# Patient Record
Sex: Male | Born: 1980 | Race: White | Hispanic: No | Marital: Married | Smoking: Current every day smoker
Health system: Southern US, Community
[De-identification: ages and names within clinical notes are randomized; demographics above are authoritative.]

---

## 2001-03-07 ENCOUNTER — Emergency Department (HOSPITAL_COMMUNITY): Admission: EM | Admit: 2001-03-07 | Discharge: 2001-03-07 | Payer: Self-pay | Admitting: Emergency Medicine

## 2001-03-07 ENCOUNTER — Encounter: Payer: Self-pay | Admitting: Emergency Medicine

## 2002-06-12 ENCOUNTER — Ambulatory Visit (HOSPITAL_BASED_OUTPATIENT_CLINIC_OR_DEPARTMENT_OTHER): Admission: RE | Admit: 2002-06-12 | Discharge: 2002-06-12 | Payer: Self-pay | Admitting: General Surgery

## 2005-03-13 ENCOUNTER — Emergency Department (HOSPITAL_COMMUNITY): Admission: EM | Admit: 2005-03-13 | Discharge: 2005-03-13 | Payer: Self-pay | Admitting: Emergency Medicine

## 2008-07-07 ENCOUNTER — Inpatient Hospital Stay (HOSPITAL_COMMUNITY): Admission: EM | Admit: 2008-07-07 | Discharge: 2008-07-11 | Payer: Self-pay | Admitting: Emergency Medicine

## 2009-07-14 IMAGING — CR DG KNEE 1-2V*L*
2 series · 2 of 2 positions shown · non-contrast
Comparison: None.

CLINICAL DATA: Left knee joint infection.  Post aspiration images.

LEFT KNEE - 1-2 VIEW 07/07/2008:

[view not recorded (1 of 2)]
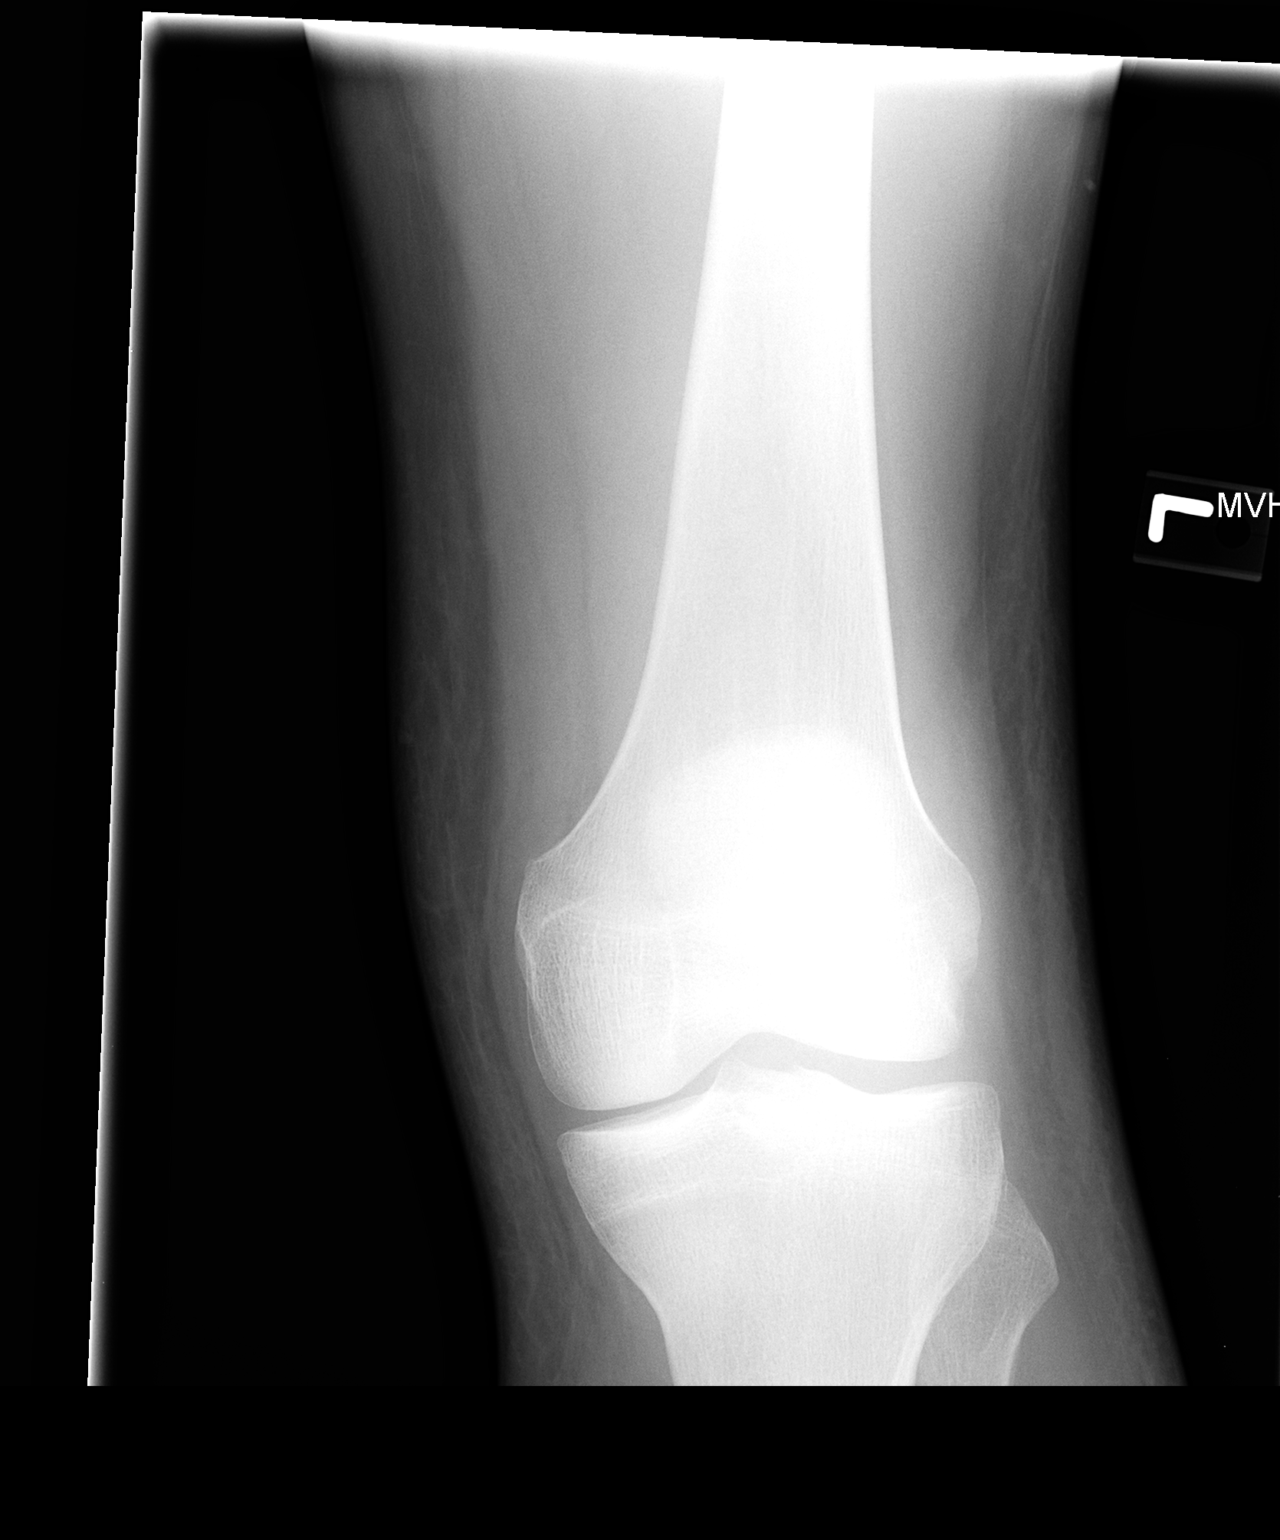

[view not recorded (2 of 2)]
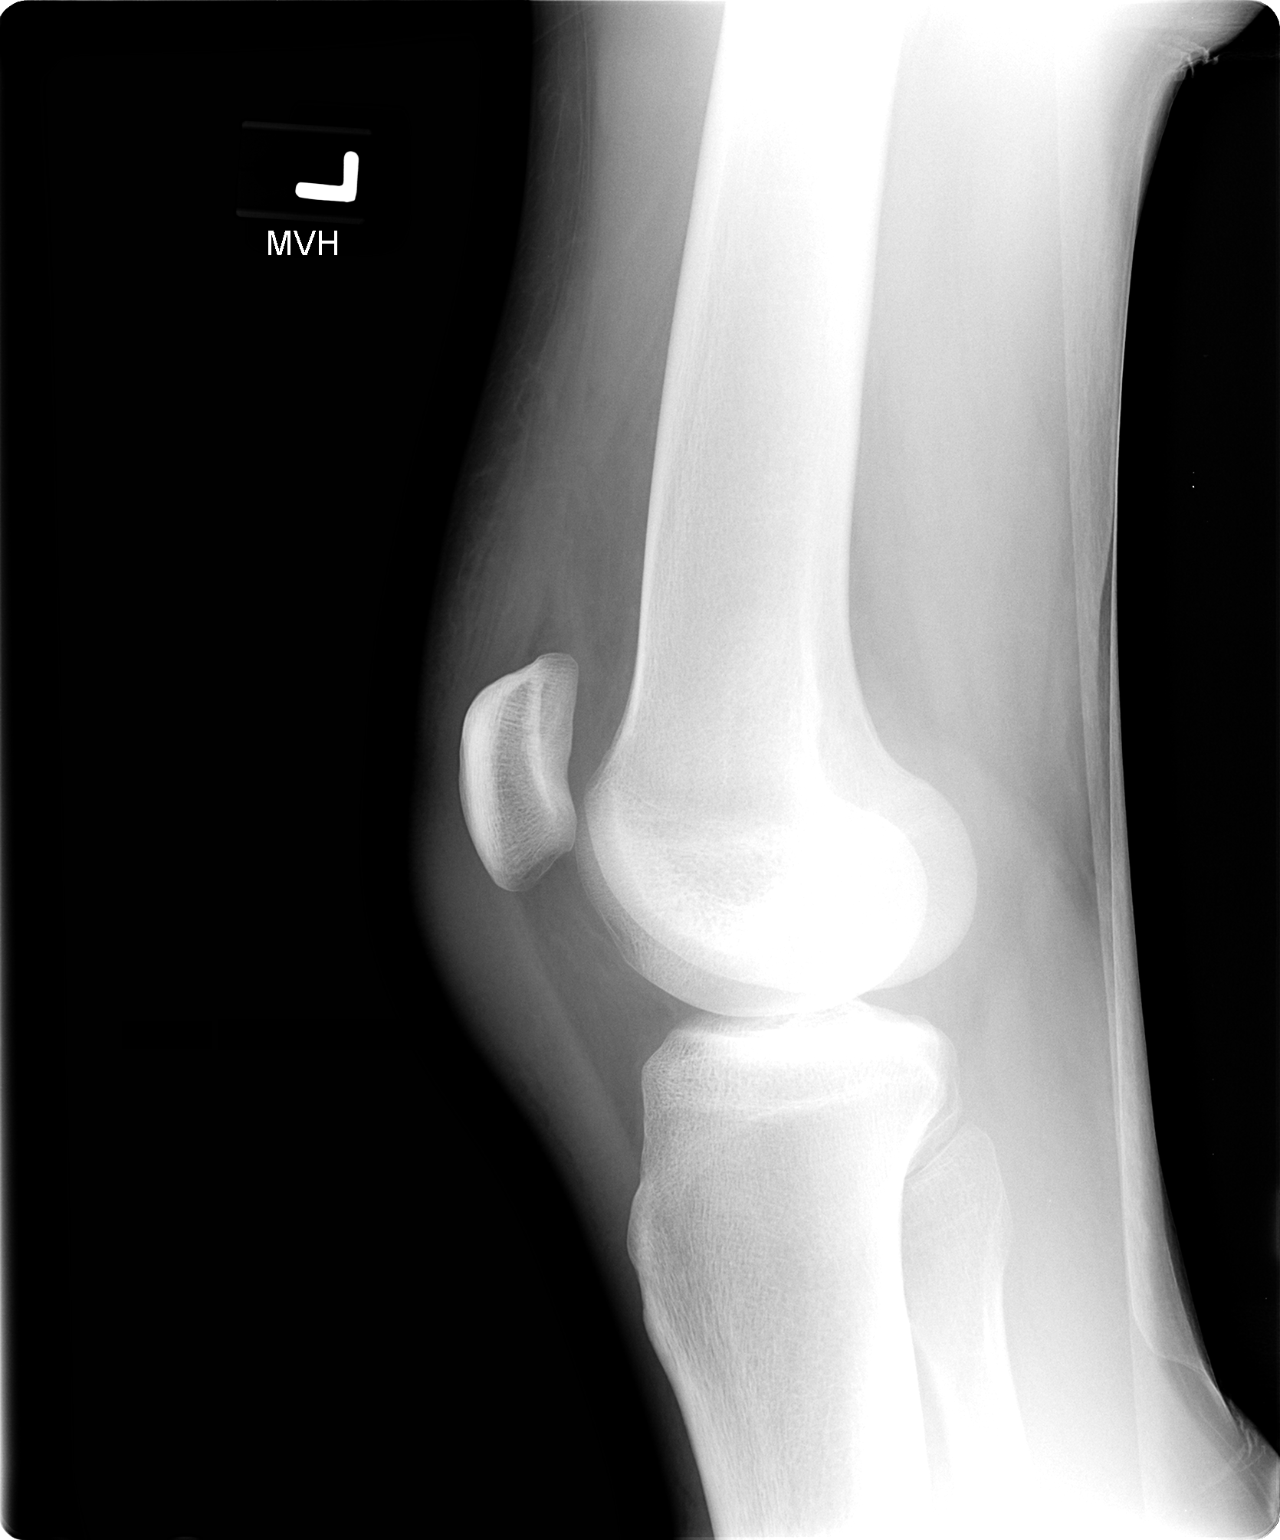

[2 of 2 positions shown; findings below may reference images not displayed]

FINDINGS: No significant residual joint effusion after joint
aspiration.  No underlying osseous abnormalities.  Well-preserved
joint spaces.  Marked soft tissue swelling overlying the patella.
IMPRESSION: No significant residual joint effusion after joint aspiration.  No
skeletal abnormality.

## 2010-10-19 LAB — CBC
HCT: 34.9 % — ABNORMAL LOW (ref 39.0–52.0)
HCT: 36.5 % — ABNORMAL LOW (ref 39.0–52.0)
HCT: 41.1 % (ref 39.0–52.0)
Hemoglobin: 13.8 g/dL (ref 13.0–17.0)
MCHC: 32.9 g/dL (ref 30.0–36.0)
MCHC: 33.5 g/dL (ref 30.0–36.0)
MCHC: 33.6 g/dL (ref 30.0–36.0)
MCHC: 34.2 g/dL (ref 30.0–36.0)
MCV: 92.4 fL (ref 78.0–100.0)
MCV: 93.3 fL (ref 78.0–100.0)
MCV: 94 fL (ref 78.0–100.0)
Platelets: 186 10*3/uL (ref 150–400)
Platelets: 208 10*3/uL (ref 150–400)
Platelets: 220 10*3/uL (ref 150–400)
Platelets: 244 10*3/uL (ref 150–400)
RBC: 3.67 MIL/uL — ABNORMAL LOW (ref 4.22–5.81)
RBC: 4.4 MIL/uL (ref 4.22–5.81)
RDW: 12.2 % (ref 11.5–15.5)
RDW: 12.3 % (ref 11.5–15.5)
WBC: 14.3 10*3/uL — ABNORMAL HIGH (ref 4.0–10.5)
WBC: 5.6 10*3/uL (ref 4.0–10.5)

## 2010-10-19 LAB — DIFFERENTIAL
Basophils Absolute: 0 10*3/uL (ref 0.0–0.1)
Basophils Relative: 0 % (ref 0–1)
Eosinophils Absolute: 0 10*3/uL (ref 0.0–0.7)
Eosinophils Relative: 0 % (ref 0–5)
Lymphocytes Relative: 16 % (ref 12–46)
Lymphs Abs: 2.2 10*3/uL (ref 0.7–4.0)
Monocytes Absolute: 1 10*3/uL (ref 0.1–1.0)
Monocytes Relative: 7 % (ref 3–12)
Neutro Abs: 11 10*3/uL — ABNORMAL HIGH (ref 1.7–7.7)
Neutrophils Relative %: 77 % (ref 43–77)

## 2010-10-19 LAB — BODY FLUID CULTURE: Culture: NO GROWTH

## 2010-10-19 LAB — BASIC METABOLIC PANEL
BUN: 10 mg/dL (ref 6–23)
BUN: 13 mg/dL (ref 6–23)
BUN: 9 mg/dL (ref 6–23)
CO2: 26 mEq/L (ref 19–32)
CO2: 27 mEq/L (ref 19–32)
CO2: 30 mEq/L (ref 19–32)
CO2: 30 mEq/L (ref 19–32)
Calcium: 8.3 mg/dL — ABNORMAL LOW (ref 8.4–10.5)
Calcium: 9.7 mg/dL (ref 8.4–10.5)
Chloride: 100 mEq/L (ref 96–112)
Chloride: 103 mEq/L (ref 96–112)
Chloride: 99 mEq/L (ref 96–112)
Creatinine, Ser: 0.92 mg/dL (ref 0.4–1.5)
Creatinine, Ser: 0.95 mg/dL (ref 0.4–1.5)
Creatinine, Ser: 0.96 mg/dL (ref 0.4–1.5)
Creatinine, Ser: 0.99 mg/dL (ref 0.4–1.5)
GFR calc Af Amer: >60 mL/min (ref >60)
GFR calc Af Amer: >60 mL/min (ref >60)
GFR calc non Af Amer: >60 mL/min (ref >60)
GFR calc non Af Amer: >60 mL/min (ref >60)
Glucose, Bld: 125 mg/dL — ABNORMAL HIGH (ref 70–99)
Glucose, Bld: 99 mg/dL (ref 70–99)
Potassium: 3.2 mEq/L — ABNORMAL LOW (ref 3.5–5.1)
Potassium: 3.4 mEq/L — ABNORMAL LOW (ref 3.5–5.1)
Sodium: 136 mEq/L (ref 135–145)

## 2010-10-19 LAB — ANAEROBIC CULTURE

## 2010-10-19 LAB — RETICULOCYTES
RBC.: 3.98 MIL/uL — ABNORMAL LOW (ref 4.22–5.81)
Retic Count, Absolute: 23.9 10*3/uL (ref 19.0–186.0)
Retic Ct Pct: 0.6 % (ref 0.4–3.1)

## 2010-10-19 LAB — HEMOGLOBINOPATHY EVALUATION
Hgb A2 Quant: 3 % (ref 2.2–3.2)
Hgb F Quant: 0 % (ref 0.0–2.0)

## 2010-10-19 LAB — C-REACTIVE PROTEIN: CRP: 4.6 mg/dL — ABNORMAL HIGH (ref <0.6)

## 2010-10-19 LAB — IRON AND TIBC: Saturation Ratios: 4 % — ABNORMAL LOW (ref 20–55)

## 2010-10-19 LAB — MAGNESIUM: Magnesium: 2.1 mg/dL (ref 1.5–2.5)

## 2010-10-19 LAB — FOLATE: Folate: 7 ng/mL

## 2010-11-17 NOTE — H&P (Signed)
NAME:  Ian Oconnor NO.:  [id number]   MEDICAL RECORD NO.:  [id number]          PATIENT TYPE:  EMS   LOCATION:  ED                           FACILITY:  [facility]   PHYSICIAN:  Lonia Blood, M.D.      DATE OF BIRTH:  August 10, 1980   DATE OF ADMISSION:  07/07/2008  DATE OF DISCHARGE:                              HISTORY & PHYSICAL   PRIMARY CARE PHYSICIAN:  The patient is unassigned to [place].   PRESENTING COMPLAINT:  Left knee pain.   HISTORY OF PRESENT ILLNESS:  The patient is a 30 year old young man with  no significant past medical history who has apparently been doing all  right until yesterday.  The patient started feeling itchy upon his knee.  He subsequently looked  and apparently has like a piece of hair growing  inwards  inside his knee.  He tried to remove it which he thought he  successfully did.  Since then, however, his knee has started pain,  swelling, and redness.  It got unbearable that he could not bear any  weight.  He came to the emergency room where he was seen by orthopedic  surgeon Dr. Shon Baton, and arthrocentesis was performed.  The patient is  now being admitted for further treatment.   PAST MEDICAL HISTORY:  Significant for only head injury.  Otherwise, no  significant past medical history.   MEDICATIONS:  None.   ALLERGIES:  He has no known drug allergies.   SOCIAL HISTORY:  The patient is single, lives with his girlfriend.  Denied any tobacco, alcohol or IV drug use.   FAMILY HISTORY:  Denied any significant family history for diabetes,  hypertension, etc.   REVIEW OF SYSTEMS:  Ten-point review of system is negative except per  HPI.   PHYSICAL EXAMINATION:  Temperature is 98.2, blood pressure 96/65, pulse  67, respiratory rate 18, sats 100% on room air.  GENERAL:  The patient is a pleasant young man in no acute distress.  HEENT:  PERRL.  EOMI.  NECK:  Supple.  No JVD, no lymphadenopathy.  RESPIRATORY:  He has good air entry bilaterally.   No wheezes, no rales.  CARDIOVASCULAR:  S1, S2, no murmur.  ABDOMEN:  Soft, nontender with positive bowel sounds.  EXTREMITIES:  Showed left knee is swollen, red, and tender to touch.  No  obvious derangement.  No shortening of the left lower extremity compared  to the right lower extremity.  No other significant findings.   LABORATORIES:  Showed a white count of 14.3, hemoglobin 13.8, platelet  count 220 with a left shift ANC of 11.  Sodium 136, potassium 3.2,  chloride 100, CO2 27, glucose 99, BUN 13, creatinine 0.92.  Calcium 9.7.  The patient also has arthrocenteses, and results are currently pending.   ASSESSMENT:  Therefore this is a 30 year old young man presented with  what appeared to be septic arthritis from probably penetrating hair into  his joint space.   PLAN:  1. Septic arthritis.  Will admit the patient and start intravenous      Ancef  1 g q.6.  Will await results of Gram stain cytology and      crystals from his arthrocentesis.  Will also get blood cultures and      await the results.  Based on how patient responds, will gradually      transition him to oral antibiotics.  2. Hypokalemia.  I will replete his potassium now.  3. Transient hypertension.  This may be related to pain medicine.  I      will give him intravenous fluids, and watch closely for any signs      of sepsis.      Lonia Blood, M.D.  Electronically Signed     LG/MEDQ  D:  07/07/2008  T:  07/08/2008  Job:  [id number]

## 2010-11-17 NOTE — Discharge Summary (Signed)
NAME:  Ian Oconnor, Ian Oconnor NO.:  [id number]   MEDICAL RECORD NO.:  [id number]          PATIENT TYPE:  INP   LOCATION:  1514                         FACILITY:  [facility]   PHYSICIAN:  Peggye Pitt, M.D. DATE OF BIRTH:  03-28-1981   DATE OF ADMISSION:  07/07/2008  DATE OF DISCHARGE:                               DISCHARGE SUMMARY   DISCHARGE DIAGNOSES:  Left knee cellulitis.   DISCHARGE MEDICATIONS:  1. Doxycycline 100 mg p.o. twice daily for a total of 10 days.  2. Vicodin 5/500 mg to take 1 tablet every 6 hours as needed for pain.      I have dispensed #30 tablets.   DISPOSITION AND FOLLOWUP:  The patient is discharged home in stable  condition.  He has met all of his physical therapy goals.  I have  advised him to call Dr. Jeannetta Nap' office for an appointment within the  next couple weeks to follow up on the resolution of his cellulitis.   CONSULTATIONS THIS HOSPITALIZATION:  Dr. Shon Baton with orthopedics.   IMAGES AND PROCEDURES PERFORMED DURING THIS HOSPITALIZATION:  Include an  x-ray of his left knee on July 07, 2008 that showed no significant  residual joint effusion after joint aspiration with no skeletal  abnormality.   HISTORY AND PHYSICAL EXAM:  For full details please refer to history and  physical dictated on July 07, 2008 by Dr. Mikeal Hawthorne, but in brief, Mr.  Oconnor is a 30 year old Caucasian man with no significant past medical  history who, a couple days of prior to admission, noticed an ingrown  hair over the inferior aspect of his patella on his left knee.  Subsequently, his knee grew to be red and swollen with unbearable pain.  For that reason, he decided to come into the emergency department for  further evaluation.  For that reason, we were called and admitted for  further evaluation and management.   HOSPITAL COURSE BY ACTIVE PROBLEM:  Left knee cellulitis.  Initially Dr.  Shon Baton, orthopedic surgeon, was consulted in the emergency department,  who  performed an arthrocentesis given that the initial concern was for  septic arthritis.  However, cultures from that joint aspiration have not  grown any organisms.  He has received 4 days of Ancef while in the  hospital.  He will subsequently be discharged on doxycycline to take for  10 more days.  I have advised him to follow up with his primary care  physician, Dr. Jeannetta Nap, in a couple weeks to ensure resolution of his  cellulitis.   VITAL SIGNS ON DAY OF DISCHARGE:  Blood pressure 92/65, heart rate 71,  respirations 18, O2 sats 97% on room air with a temperature of 97.8.   LABS ON DAY OF DISCHARGE:  Sodium 136, potassium 4.2, chloride 99,  bicarb 30, BUN 10, creatinine 0.95 and a glucose of 97.  WBCs 5.8,  hemoglobin 13.3, and platelets of 244,000.      Peggye Pitt, M.D.  Electronically Signed     EH/MEDQ  D:  07/11/2008  T:  07/11/2008  Job:  [id number]  cc:   Windle Guard, M.D.  Fax: [phone]   Dr. Shon Baton  Orthopedics

## 2010-11-17 NOTE — Consult Note (Signed)
NAME:  Ian Oconnor, Ian Oconnor NO.:  [id number]   MEDICAL RECORD NO.:  [id number]          PATIENT TYPE:  INP   LOCATION:  1514                         FACILITY:  [facility]   PHYSICIAN:  Alvy Beal, MD    DATE OF BIRTH:  [birth year]   DATE OF CONSULTATION:  DATE OF DISCHARGE:                                 CONSULTATION   Consultation request by the ER staff for evaluation of left knee  infection, question cellulitis versus septic joint.   HISTORY:  Sun is a very pleasant, otherwise healthy 30 year old  gentleman who was in his usual state of good stated health until Friday  when he noticed an ingrown hair on his left knee.  This was a small  purulent-type pocket and there was some minimal discomfort.  The pain  got worse Saturday and he also noted some mild swelling.  As a result he  presents today to the ER because of increasing pain.  He is able to walk  on it and weightbear but it is painful.  He is able to bend the knee  slightly but there is loss of full range of motion.  As a result of the  symptoms I was asked to evaluate him for a question of a septic joint.   His past medical, surgical, family, social history:  He has NO KNOWN  DRUG ALLERGIES.  He is on no current medications.  No significant  medical history.  He does not use alcohol or tobacco.  His only surgical  history is a hernia repaired several years ago.   CLINICAL EXAM:  He is a pleasant gentleman who appears his stated age in  no acute distress.  He is alert and oriented x3.  He is afebrile, stable  vital signs.  No shortness of breath or chest pain.  ABDOMEN:  Soft and nontender.  Cranial nerves II-XII were tested, they are grossly intact.  He has had  no hip or ankle pain with range of motion on that left lower extremity.  LEFT KNEE:  It is warm to touch.  There is some mild erythema at the  patellar tendon insertion along the patellar tendon but no significant  gross diffuse erythema.  His  compartments are soft, nontender.  His  distal neurovascular exam is intact with no motor or sensory deficits,  intact 2+ palpable pulses, cap refill less than 2 seconds.  I am able to  gently range the knee left side but there is loss of motion due to pain.  He can actively range of motion again with pain and cannot take it to  full extension or full flexion.   At this point in time because of the pain and swelling I did discuss  with him treatment options.  He has not received any antibiotics so I  recommended an aspiration.  I explained the risks, benefits the  procedure.  Under sterile conditions I placed am 18-gauge needle  superior and lateral to the patella and inserted into the joint.  I  aspirated back about 10 mL of yellowish  clear fluid consistent with  normal synovial fluid.  There is no evidence of any gross purulent  material.   CLINICAL DIAGNOSIS:  Cellulitis, no evidence of acute intra-articular  infection.  I recommended an IV antibiotic dose and possible discharge  today.   I indicated to the ER staff that a knee immobilizer and crutches,  weightbearing as tolerated is advisable.  He has not had any labs or x-  rays.  I indicated if his C-reactive protein, sed rate or white count  was elevated, I recommend medical admission for cellulitis management  with IV antibiotics.  At this point I do not see any need for acute  surgical intervention as there is no gross purulent material within the  knee joint itself.  I will contact my partner Dr.  Penni Bombard our nonoperative orthopedic specialist for appropriate follow-up  this week as the patient has no regular care Leiloni Smithers.  I will leave the  antibiotic choice and pain medication discharge choice to the ER staff.   Xrays were reviewed that night - there was no evidence of fracure or  bony abnormality.      Alvy Beal, MD  Electronically Signed     DDB/MEDQ  D:  07/07/2008  T:  07/08/2008  Job:  [id number]

## 2010-11-20 NOTE — Op Note (Signed)
NAME:  Ian Oconnor, Ian Oconnor NO.:  [id number]   MEDICAL RECORD NO.:  [id number]                   PATIENT TYPE:  AMB   LOCATION:  DSC                                  FACILITY:  [facility]   PHYSICIAN:  Gita Kudo, M.D.              DATE OF BIRTH:  06-29-1981   DATE OF PROCEDURE:  06/12/2002  DATE OF DISCHARGE:                                 OPERATIVE REPORT   PREOPERATIVE DIAGNOSIS:  Left inguinal hernia.   POSTOPERATIVE DIAGNOSIS:  Left inguinal hernia, indirect.   OPERATION PERFORMED:  Repair of left inguinal hernia with preperitoneal plug  and onlay Prolene mesh.   SURGEON:  Gita Kudo, M.D.   ANESTHESIA:  General.   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   OPERATIVE FINDINGS:  The patient had a large, long, narrow indirect hernia  sac that was empty at time of surgery.  Cord structure and nerves identified  and not injured.  The internal ring was dilated but no definite direct  hernia.   DESCRIPTION OF PROCEDURE:  Under satisfactory general anesthesia, having  received 1.0 gm Ancef preoperatively, the patient's abdomen was prepped and  draped  in a standard fashion.  The incision was infiltrated with Marcaine  throughout, using a total of 30 cc of 0.5% with epinephrine.  A transverse  incision was made and carried down to and through the external ring and  external oblique.  Bleeders coagulated, overtied with 3-0 Vicryl.  Self-  retaining retractor was used and excellent exposure obtained.  The cord and  its contents mobilized, indirect sac identified, dissected high and twisted  and secured with 0 Prolene suture and infiltrated with Marcaine and then  excess excised.  The floor of the canal appeared a little weak but no  definite direct hernia.  The internal ring was large and therefore, a  plug  was fashioned.   One third of the 3 x 6 inch piece of Prolene mesh was cut and then  transformed into a cone.  This was then inserted in the preperitoneal space  at the internal ring and tacked to the edges of the defect with interrupted  0 Prolene suture times three.  Then the internal ring was closed with a  single 0 Prolene suture and the ends of that suture left long.  The  remainder of the mesh was then made into an oval with a slit to go around  the cord structure.  It was anchored at the internal ring with a previously  placed suture and then tacked to the soft tissue near the pubis, inguinal  ligament, internal oblique and then the tails sutured to each other around  the cord lateral to the ring.  The  wound lavaged with saline, closed in  layers of running 2-0 Vicryl for external oblique, 2-0 Vicryl for deep  fascia, 3-0 Vicryl for subcu, Steri-Strips for skin.  Sterile dressings  applied and patient to the recovery room in good condition.                                               Gita Kudo, M.D.     MRL/MEDQ  D:  06/12/2002  T:  06/12/2002  Job:  [id number]   cc:   Louanna Raw  [address]  [place]  [place] [address]  Fax: [phone]

## 2013-10-14 ENCOUNTER — Emergency Department (HOSPITAL_BASED_OUTPATIENT_CLINIC_OR_DEPARTMENT_OTHER): Payer: Self-pay

## 2013-10-14 ENCOUNTER — Encounter (HOSPITAL_BASED_OUTPATIENT_CLINIC_OR_DEPARTMENT_OTHER): Payer: Self-pay | Admitting: Emergency Medicine

## 2013-10-14 ENCOUNTER — Emergency Department (HOSPITAL_BASED_OUTPATIENT_CLINIC_OR_DEPARTMENT_OTHER)
Admission: EM | Admit: 2013-10-14 | Discharge: 2013-10-14 | Disposition: A | Discharge status: Home / Self Care | Payer: Self-pay | Attending: Emergency Medicine | Admitting: Emergency Medicine

## 2013-10-14 DIAGNOSIS — R079 Chest pain, unspecified: Secondary | ICD-10-CM | POA: Insufficient documentation

## 2013-10-14 DIAGNOSIS — F17200 Nicotine dependence, unspecified, uncomplicated: Secondary | ICD-10-CM | POA: Insufficient documentation

## 2013-10-14 DIAGNOSIS — B9789 Other viral agents as the cause of diseases classified elsewhere: Secondary | ICD-10-CM | POA: Insufficient documentation

## 2013-10-14 DIAGNOSIS — B349 Viral infection, unspecified: Secondary | ICD-10-CM

## 2013-10-14 NOTE — ED Provider Notes (Signed)
CSN: [id number]     Arrival date & time 10/14/13  2054 History  This chart was scribed for Ethelda ChickMartha K Linker, MD by Beverly MilchJ Harrison Collins, ED Scribe. This patient was seen in room MH05/MH05 and the patient's care was started at 10:01 PM.    Chief Complaint  Patient presents with  . Cough      Patient is a 33 y.o. male presenting with cough. The history is provided by the patient. No language interpreter was used.  Cough Cough characteristics:  Non-productive and productive Sputum characteristics:  Green Severity:  Mild Onset quality:  Sudden Duration:  1 week Timing:  Constant Progression:  Worsening Chronicity:  New Smoker: yes   Context: sick contacts   Worsened by:  Smoking (Pt reports .25 pack a day) Ineffective treatments:  Decongestant Associated symptoms: chest pain, fever and sore throat   Associated symptoms: no sinus congestion   Associated symptoms comment:  Pt reports night sweats. Risk factors: recent infection     History reviewed. No pertinent past medical history. History reviewed. No pertinent past surgical history. History reviewed. No pertinent family history. History  Substance Use Topics  . Smoking status: Current Every Day Smoker  . Smokeless tobacco: Not on file  . Alcohol Use: No    Review of Systems  Constitutional: Positive for fever.  HENT: Positive for sore throat.   Respiratory: Positive for cough.   Cardiovascular: Positive for chest pain.  All other systems reviewed and are negative.     Allergies  Review of patient's allergies indicates no known allergies.  Home Medications  No current outpatient prescriptions on file.  Triage Vitals: BP 122/92  Pulse 93  Temp(Src) 97.4 F (36.3 C) (Oral)  Resp 24  Ht 6' (1.829 m)  Wt 165 lb (74.844 kg)  BMI 22.37 kg/m2  SpO2 100%  Physical Exam  Nursing note and vitals reviewed. Constitutional: He appears well-developed and well-nourished. No distress.  HENT:  Head: Normocephalic and  atraumatic.  Right Ear: Tympanic membrane and external ear normal.  Left Ear: Tympanic membrane and external ear normal.  Mouth/Throat: Uvula is midline. No oropharyngeal exudate or posterior oropharyngeal erythema.  Eyes: Conjunctivae are normal. Right eye exhibits no discharge. Left eye exhibits no discharge. No scleral icterus.  Neck: Neck supple. No tracheal deviation present.  Cardiovascular: Normal rate, regular rhythm and intact distal pulses.   Pulmonary/Chest: Effort normal and breath sounds normal. No stridor. No respiratory distress. He has no wheezes. He has no rales.  Abdominal: Soft. Bowel sounds are normal. He exhibits no distension. There is no tenderness. There is no rebound and no guarding.  Musculoskeletal: He exhibits no edema and no tenderness.  Neurological: He is alert. He has normal strength. No cranial nerve deficit (no facial droop, extraocular movements intact, no slurred speech) or sensory deficit. He exhibits normal muscle tone. He displays no seizure activity. Coordination normal.  Skin: Skin is warm and dry. No rash noted.  Psychiatric: He has a normal mood and affect.    ED Course  Procedures (including critical care time)  DIAGNOSTIC STUDIES: Oxygen Saturation is 100% on RA, normal by my interpretation.    COORDINATION OF CARE: 10:09 PM- Pt advised of plan for treatment and pt agrees.    Labs Review Labs Reviewed - No data to display Imaging Review No results found.   EKG Interpretation None      MDM   Final diagnoses:  Viral infection    Pt presenting with c/o nonrpoductive cough.  cxr reassuring.  Discussed symptomatic care.  Discharged with strict return precautions.  Pt agreeable with plan.  I personally performed the services described in this documentation, which was scribed in my presence. The recorded information has been reviewed and is accurate.    Ethelda Chick, MD 10/17/13 [phone]

## 2013-10-14 NOTE — ED Notes (Signed)
p reports cough nonproductive that is causing him rib area pain

## 2013-10-14 NOTE — Discharge Instructions (Signed)
Return to the ED with any concerns including difficulty breathing, vomiting and not able to keep down liquids, decreased level of alertness/lethargy, or any other alarming symptoms °

## 2013-10-14 NOTE — ED Notes (Signed)
Reports cough and chills earlier in the week which resolved- today sx returned

## 2013-10-15 NOTE — ED Notes (Signed)
D/c home with family- no rx gvien

## 2014-10-21 IMAGING — CR DG CHEST 2V
2 series · 2 of 2 positions shown · non-contrast
Comparison: No comparisons

CLINICAL DATA: Cough.

EXAM:
CHEST  2 VIEW

[w chest pa]
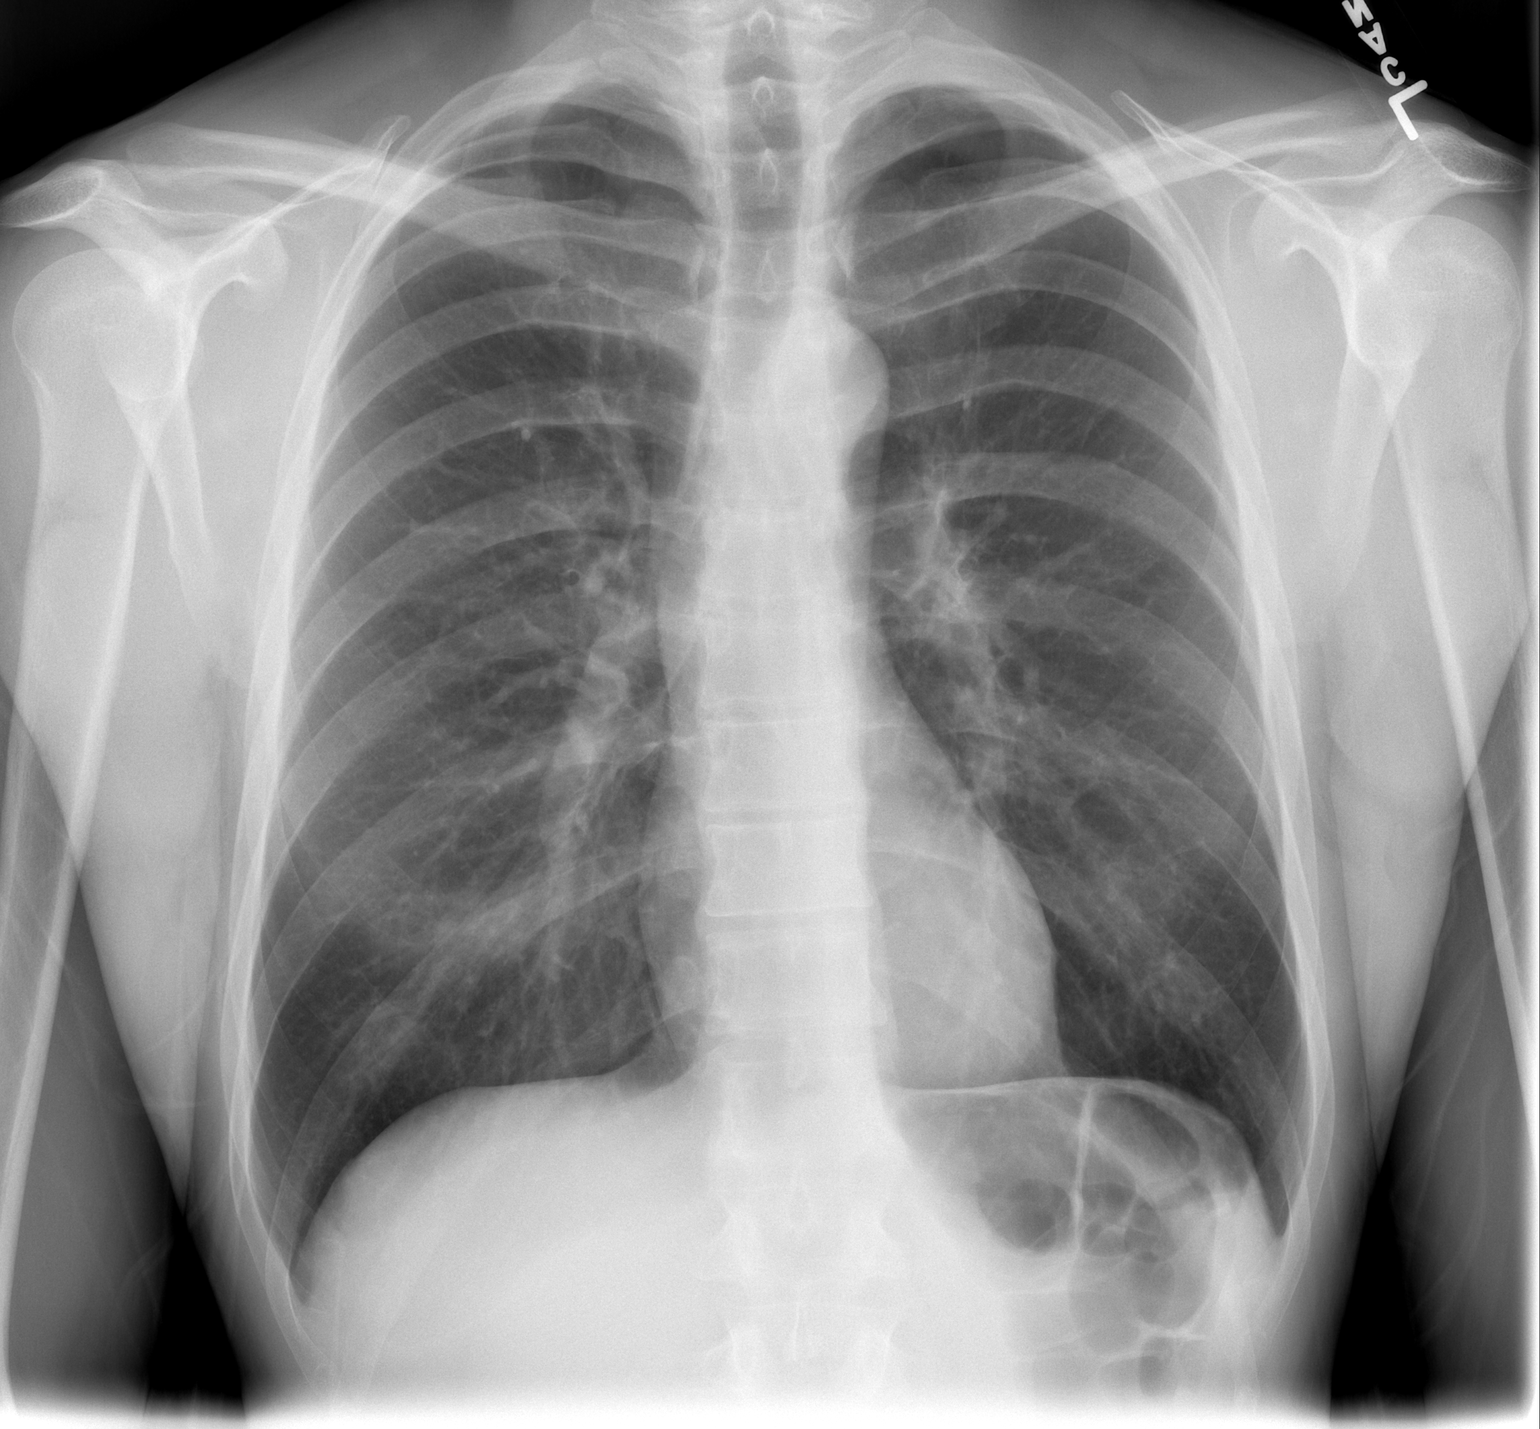

[w chest lat]
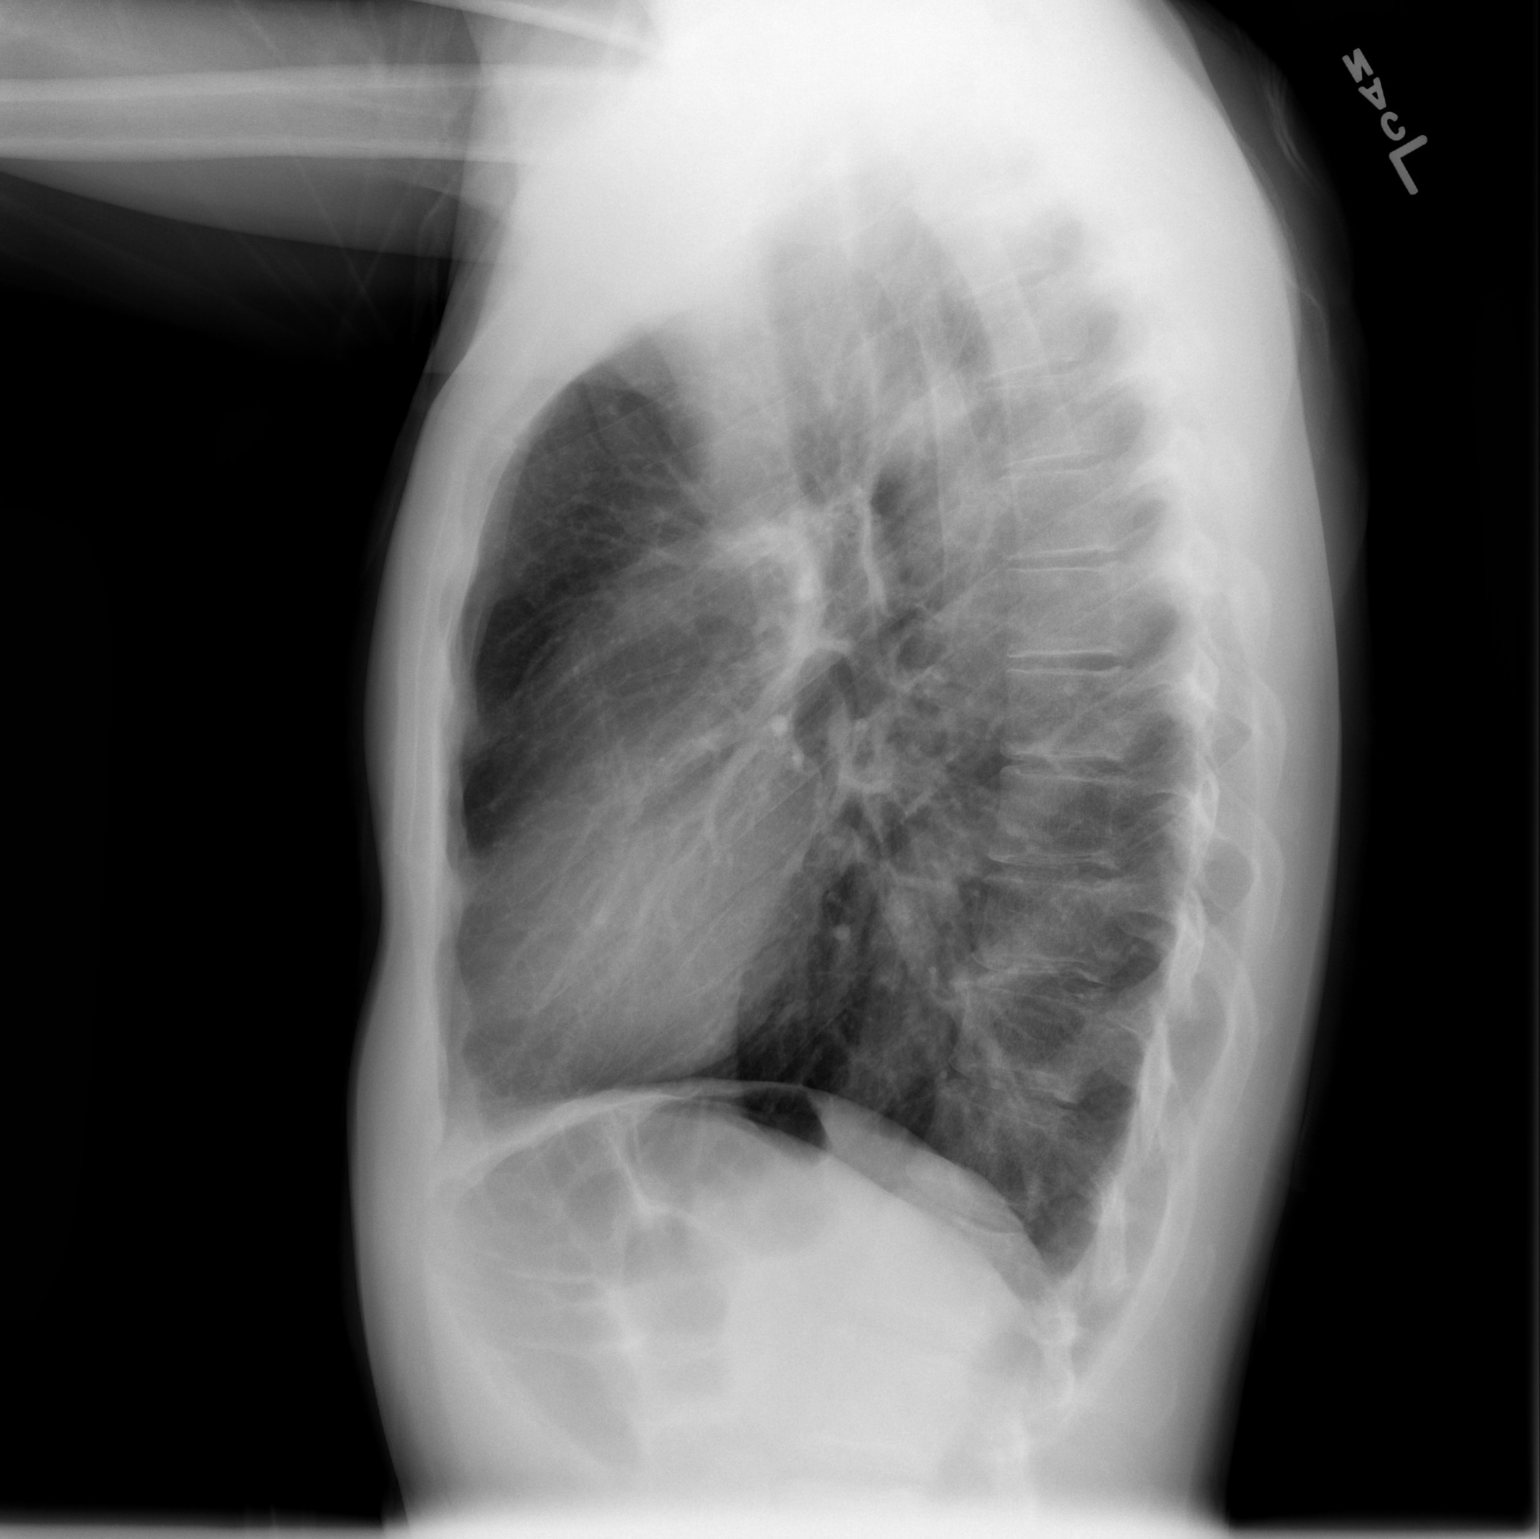

[2 of 2 positions shown; findings below may reference images not displayed]

FINDINGS: Cardiomediastinal silhouette is unremarkable. The lungs are clear
without pleural effusions or focal consolidations. Trachea projects
midline and there is no pneumothorax. Soft tissue planes and
included osseous structures are non-suspicious.
IMPRESSION: No acute cardiopulmonary process.

  By: Elmanu Naguila

## 2017-05-10 ENCOUNTER — Encounter (HOSPITAL_BASED_OUTPATIENT_CLINIC_OR_DEPARTMENT_OTHER): Payer: Self-pay | Admitting: *Deleted

## 2017-05-10 ENCOUNTER — Emergency Department (HOSPITAL_BASED_OUTPATIENT_CLINIC_OR_DEPARTMENT_OTHER)
Admission: EM | Admit: 2017-05-10 | Discharge: 2017-05-10 | Disposition: A | Discharge status: Home / Self Care | Payer: Self-pay | Attending: Emergency Medicine | Admitting: Emergency Medicine

## 2017-05-10 ENCOUNTER — Other Ambulatory Visit: Payer: Self-pay

## 2017-05-10 DIAGNOSIS — Z5321 Procedure and treatment not carried out due to patient leaving prior to being seen by health care provider: Secondary | ICD-10-CM | POA: Insufficient documentation

## 2017-05-10 DIAGNOSIS — L02511 Cutaneous abscess of right hand: Secondary | ICD-10-CM | POA: Insufficient documentation

## 2017-05-10 NOTE — ED Triage Notes (Signed)
Abscess to his right 5th digit. It looks bad. Swelling, draining pus.
